# Patient Record
Sex: Female | Born: 1970 | Race: White | Hispanic: No | Marital: Married | Smoking: Never smoker
Health system: Southern US, Community
[De-identification: ages and names within clinical notes are randomized; demographics above are authoritative.]

## PROBLEM LIST (undated history)

## (undated) DIAGNOSIS — N289 Disorder of kidney and ureter, unspecified: Secondary | ICD-10-CM

---

## 1998-02-09 ENCOUNTER — Ambulatory Visit (HOSPITAL_COMMUNITY): Admission: RE | Admit: 1998-02-09 | Discharge: 1998-02-09 | Payer: Self-pay | Admitting: Obstetrics and Gynecology

## 1998-10-14 ENCOUNTER — Inpatient Hospital Stay (HOSPITAL_COMMUNITY): Admission: AD | Admit: 1998-10-14 | Discharge: 1998-10-14 | Payer: Self-pay | Admitting: Obstetrics and Gynecology

## 1998-10-14 ENCOUNTER — Encounter: Payer: Self-pay | Admitting: Obstetrics & Gynecology

## 1998-10-14 ENCOUNTER — Inpatient Hospital Stay (HOSPITAL_COMMUNITY): Admission: AD | Admit: 1998-10-14 | Discharge: 1998-10-16 | Payer: Self-pay | Admitting: Obstetrics & Gynecology

## 1998-10-15 ENCOUNTER — Encounter: Payer: Self-pay | Admitting: Obstetrics & Gynecology

## 1999-03-18 ENCOUNTER — Other Ambulatory Visit: Admission: RE | Admit: 1999-03-18 | Discharge: 1999-03-18 | Payer: Self-pay | Admitting: Obstetrics and Gynecology

## 1999-07-02 ENCOUNTER — Inpatient Hospital Stay (HOSPITAL_COMMUNITY): Admission: AD | Admit: 1999-07-02 | Discharge: 1999-07-02 | Payer: Self-pay | Admitting: Obstetrics and Gynecology

## 1999-07-05 ENCOUNTER — Inpatient Hospital Stay (HOSPITAL_COMMUNITY): Admission: AD | Admit: 1999-07-05 | Discharge: 1999-07-05 | Payer: Self-pay | Admitting: *Deleted

## 1999-07-09 ENCOUNTER — Ambulatory Visit (HOSPITAL_COMMUNITY): Admission: RE | Admit: 1999-07-09 | Discharge: 1999-07-09 | Payer: Self-pay | Admitting: Obstetrics and Gynecology

## 1999-07-19 ENCOUNTER — Inpatient Hospital Stay (HOSPITAL_COMMUNITY): Admission: AD | Admit: 1999-07-19 | Discharge: 1999-07-28 | Payer: Self-pay | Admitting: Obstetrics and Gynecology

## 1999-07-22 ENCOUNTER — Encounter: Payer: Self-pay | Admitting: Obstetrics and Gynecology

## 1999-07-27 ENCOUNTER — Encounter: Payer: Self-pay | Admitting: Obstetrics and Gynecology

## 1999-08-11 ENCOUNTER — Inpatient Hospital Stay (HOSPITAL_COMMUNITY): Admission: AD | Admit: 1999-08-11 | Discharge: 1999-08-11 | Payer: Self-pay | Admitting: *Deleted

## 1999-08-21 ENCOUNTER — Inpatient Hospital Stay (HOSPITAL_COMMUNITY): Admission: AD | Admit: 1999-08-21 | Discharge: 1999-08-21 | Payer: Self-pay | Admitting: Physical Therapy

## 1999-10-05 ENCOUNTER — Other Ambulatory Visit: Admission: RE | Admit: 1999-10-05 | Discharge: 1999-10-05 | Payer: Self-pay | Admitting: Obstetrics and Gynecology

## 2000-10-25 ENCOUNTER — Other Ambulatory Visit: Admission: RE | Admit: 2000-10-25 | Discharge: 2000-10-25 | Payer: Self-pay | Admitting: Obstetrics and Gynecology

## 2001-09-11 ENCOUNTER — Encounter (INDEPENDENT_AMBULATORY_CARE_PROVIDER_SITE_OTHER): Payer: Self-pay | Admitting: Specialist

## 2001-09-11 ENCOUNTER — Ambulatory Visit (HOSPITAL_BASED_OUTPATIENT_CLINIC_OR_DEPARTMENT_OTHER): Admission: RE | Admit: 2001-09-11 | Discharge: 2001-09-11 | Payer: Self-pay | Admitting: Plastic Surgery

## 2002-03-15 ENCOUNTER — Other Ambulatory Visit: Admission: RE | Admit: 2002-03-15 | Discharge: 2002-03-15 | Payer: Self-pay | Admitting: Obstetrics and Gynecology

## 2003-07-07 ENCOUNTER — Other Ambulatory Visit: Admission: RE | Admit: 2003-07-07 | Discharge: 2003-07-07 | Payer: Self-pay | Admitting: Obstetrics and Gynecology

## 2003-12-25 ENCOUNTER — Encounter: Admission: RE | Admit: 2003-12-25 | Discharge: 2003-12-25 | Payer: Self-pay | Admitting: Neurology

## 2004-01-03 ENCOUNTER — Encounter: Admission: RE | Admit: 2004-01-03 | Discharge: 2004-01-03 | Payer: Self-pay | Admitting: Neurology

## 2004-03-26 ENCOUNTER — Encounter (INDEPENDENT_AMBULATORY_CARE_PROVIDER_SITE_OTHER): Payer: Self-pay | Admitting: Specialist

## 2004-03-26 ENCOUNTER — Ambulatory Visit (HOSPITAL_COMMUNITY): Admission: RE | Admit: 2004-03-26 | Discharge: 2004-03-26 | Payer: Self-pay | Admitting: Obstetrics and Gynecology

## 2005-02-03 ENCOUNTER — Other Ambulatory Visit: Admission: RE | Admit: 2005-02-03 | Discharge: 2005-02-03 | Payer: Self-pay | Admitting: Obstetrics and Gynecology

## 2005-06-20 ENCOUNTER — Encounter (INDEPENDENT_AMBULATORY_CARE_PROVIDER_SITE_OTHER): Payer: Self-pay | Admitting: *Deleted

## 2005-06-20 ENCOUNTER — Ambulatory Visit (HOSPITAL_COMMUNITY): Admission: RE | Admit: 2005-06-20 | Discharge: 2005-06-21 | Payer: Self-pay | Admitting: Obstetrics and Gynecology

## 2008-06-23 ENCOUNTER — Encounter: Admission: RE | Admit: 2008-06-23 | Discharge: 2008-06-23 | Payer: Self-pay | Admitting: Obstetrics and Gynecology

## 2010-07-16 NOTE — Discharge Summary (Signed)
NAME:  Kelly Davenport, BARIA NO.:  [id number]   MEDICAL RECORD NO.:  [id number]          PATIENT TYPE:  OIB   LOCATION:  9309                          FACILITY:  [facility]   PHYSICIAN:  Duke Salvia. Marcelle Overlie, M.D.DATE OF BIRTH:  11-Jul-1970   DATE OF ADMISSION:  06/20/2005  DATE OF DISCHARGE:  06/21/2005                                 DISCHARGE SUMMARY   DISCHARGE DIAGNOSES:  1.  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  2.  Laparoscopic-assisted hysterectomy this admission.   SUMMARY OF HISTORY AND PHYSICAL EXAMINATION:  Please see admission H&P for  details.  Briefly, a 40 year old, prior NovaSure endometrial ablation,  presents with severe dysmenorrhea coming now for laparoscopic-assisted  hysterectomy.   HOSPITAL COURSE:  On April 23 under general anesthesia, the patient  underwent laparoscopic-assisted  hysterectomy with less than 100 mL blood  loss. The first postop day, the catheter was removed.  She was afebrile,  ambulating without difficulty.  Her abdominal exam was unremarkable, and she  was ready for discharge at that point.   LABORATORY DATA:  Preop CBC: WBC 5.2, hemoglobin 13.8.  Blood type is A  negative, antibody screen negative.  Postop CBC on April 24: WBC 10.3,  hemoglobin 11.5.   DISPOSITION:  The patient is discharged on Tylox p.r.n. pain. Will return to  the office in one week and asked to report any incisional redness or  drainage, increased pain or bleeding, or fever over 101.  She was given  specific instructions regarding diet, exercise.   CONDITION:  Good.   ACTIVITY:  Graded increase.      Richard M. Marcelle Overlie, M.D.  Electronically Signed     RMH/MEDQ  D:  06/21/2005  T:  06/21/2005  Job:  [id number]

## 2010-07-16 NOTE — Discharge Summary (Signed)
[facility]  Patient:    Kelly Davenport, Kelly Davenport                    MRN: [id number] Adm. Date:  [id number] Disc. Date: [id number] Attending:  Donne Hazel Dictator:   Danie Chandler, R.N.                           Discharge Summary  ADMITTING DIAGNOSIS:          In vitro fertilization twin gestation at 29-4/[redacted] weeks gestation with preterm labor.  DISCHARGE DIAGNOSIS:          In vitro fertilization twin gestation at 29-4/[redacted] weeks gestation with preterm labor.  PROCEDURE:                    Magnesium tocolysis.  REASON FOR ADMISSION;         The patient is a 40 year old gravida 3, para 0 with IVF twin gestation at 29-4/[redacted] weeks gestation on Terbutaline pump.  She came to maternity admissions unit early on the morning of Jul 19, 1999, complaining of uterine contractions.  She was admitted for magnesium wash. The patient received betamethasone and was placed on Unasyn and magnesium, also.  HOSPITAL COURSE:              The patients laboratory studies on admission showed hemoglobin 10.3, platelets 188,000.  Creatinine was 0.6, SGOT 21, SGPT 19, LDH 136.  Uric acid was 4.1.  The patient was continued on magnesium sulfate tocolysis, and eventually was weaned to a Terbutaline pump.  She was discharged to home on Jul 28, 1999.  CONDITION ON DISCHARGE:       Stable.  DISCHARGE INSTRUCTIONS:       She is to return to the office in one week. S he is to have weekly nonstress tests to start at 32 weeks.  She is to be on strict rest.  DISCHARGE MEDICATIONS:        Terbutaline pump as managed by Barbee Shropshire as well as home urine activity monitoring.  Prenatal vitamin, one p.o. q.d.  Iron over-the-counter q.d.  Colace over-the-counter q.d.  She was given preterm labor precautions, and she is to call for any of those indications. DD:  08/12/99 TD:  08/16/99 Job: [id number]

## 2010-07-16 NOTE — H&P (Signed)
NAME:  Kelly Davenport, Kelly Davenport NO.:  [id number]   MEDICAL RECORD NO.:  [id number]           PATIENT TYPE:   LOCATION:                                 FACILITY:   PHYSICIAN:  Duke Salvia. Marcelle Overlie, M.D.    DATE OF BIRTH:   DATE OF ADMISSION:  03/26/2004  DATE OF DISCHARGE:                                HISTORY & PHYSICAL   CHIEF COMPLAINT:  Persistent menorrhagia.   HISTORY OF PRESENT ILLNESS:  A 40 year old G3, P2.  The patient has had  bilateral salpingectomies.  She conceived with IVF her last pregnancy.  For  about a year, she has had problems with heavy irregular bleeding and  dysmenorrhea, initially tried Montserrat for ovulation suppression followed  by Ovcon without success.   SHG in our office December 05, 2003, showed a normal sized uterus, no fluid in  the cul de sac, some increased thickened endometrium along the posterior  wall but on evidence of polyp.  She presents now for [facility], hysteroscopy, and  Novasure endometrial ablation. This procedure including risk of bleeding,  infection, other complications such as perforation that may require  additional surgery all reviewed with her which she understands and accepts.   ALLERGIES:  None.   CURRENT MEDICATIONS:  Ovcon and iron.   OTHER SURGERY:  She has had a prior D&C, hysteroscopy, and laparoscopy.   FAMILY HISTORY:  Significant for a mother with hypertension, father with  heart disease.   PHYSICAL EXAMINATION:  VITAL SIGNS:  Temperature 98.2, blood pressure  120/62.  HEENT:  Unremarkable.  NECK:  Supple without masses.  LUNGS:  Clear.  CARDIOVASCULAR:  Regular rate and rhythm without murmurs, rubs, or gallops.  BREASTS:  Without masses.  ABDOMEN:  Soft, flat, and nontender.  PELVIC:  Normal external genitalia.  Vagina and cervix clear.  Uterus mid  position, normal size.  Adnexa negative.  EXTREMITIES:  Neurologic exam unremarkable.   IMPRESSION:  Persistent menorrhagia.   PLAN:  Dilation and  curettage, hysteroscopy, Novasure endometrial ablation.  Procedure and risks reviewed as above.      RMH/MEDQ  D:  03/24/2004  T:  03/24/2004  Job:  [id number]

## 2010-07-16 NOTE — Op Note (Signed)
NAME:  Kelly Davenport, Kelly Davenport NO.:  [id number]   MEDICAL RECORD NO.:  [id number]          PATIENT TYPE:  AMB   LOCATION:  SDC                           [facility]ILITY:  [facility]   PHYSICIAN:  Duke Salvia. Marcelle Overlie, M.D.DATE OF BIRTH:  [birth year]   DATE OF PROCEDURE:  06/20/2005  DATE OF DISCHARGE:                                 OPERATIVE REPORT   PREOP DIAGNOSIS:  Dysmenorrhea status post endometrial ablation, probable  adenomyosis.   POSTOP DIAGNOSIS:  Dysmenorrhea status post endometrial ablation, probable  adenomyosis.   PROCEDURE:  LSH.   SURGEON:  Duke Salvia. Marcelle Overlie, M.D.   ASSISTANT:  Juluis Mire, M.D.   ANESTHESIA:  General endotracheal.   COMPLICATIONS:  None.   DRAINS:  Foley catheter.   BLOOD LOSS:  Less than 100 mL.   SPECIMENS REMOVED:  Uterine fundus, morcellated.   COMPLICATIONS:  None.   PROCEDURE AND FINDINGS:  The patient was taken to the operating room and  after an adequate level of general endotracheal anesthesia was obtained, the  patient legs in stirrups, the abdomen, perineum and vagina prepped and  draped with Betadine, EUA was carried out.  Uterus was normal size, mobile,  adnexa negative. Foley catheter was positioned. The subumbilical area was  infiltrated with 0.5%  Marcaine plain and a small incision was made. The  Veress needle was introduced without difficulty.  Its intra-abdominal  position was verified by pressure and water testing. After 1.5 liter  pneumoperitoneum was created, laparoscopic trocar and sleeve were then  introduced without difficulty. Two lower lateral ports were non-bladed 10/11  trocars placed in the left to right lower quadrant after negative  transillumination. The patient placed in Trendelenburg. The uterus itself  was normal size. Both tubes were surgically absent. Both ovaries were  unremarkable. Using the gyrus transector instrument, the utero-ovarian  pedicle was coagulated and cut down to and including the  round ligament on  each side. The peritoneum was then carried around to the midline and the  bladder was bluntly dissected below. The uterine vascular pedicle on either  side was skeletonized.  Using initially coagulation power, the ascending  branch of the uterine close to the uterus was coagulated in two to three  separate areas and cut on each side.  The same instrument was then used to  transect the cervix with excellent hemostasis. The morcellator was then  introduced into the left lower incision. The uterine fundus specimen was  morcellated. Care was taken to irrigate and remove all uterine fragments  post morcellation. The cervical stump was then grasped, and the canal was  coagulated with the trisector instrument on coag mode in a 360 degree  fashion. Pressure was reduced. Excellent hemostasis was noted throughout.  Interceed was placed across the cervical stump.  Instruments removed.  The  left lower incision fascia was closed with  interrupted 2-0 Vicryl sutures. The subumbilical incision was closed with 4-  0 Vicryl subcuticular and the skin on both lower incisions closed with  Dermabond. She tolerated this well with recovery room in good condition.  Clear urine noted at the end  of the case.      Richard M. Marcelle Overlie, M.D.  Electronically Signed     RMH/MEDQ  D:  06/20/2005  T:  06/21/2005  Job:  [id number]

## 2010-07-16 NOTE — H&P (Signed)
NAME:  Kelly Davenport, Kelly Davenport NO.:  [id number]   MEDICAL RECORD NO.:  [id number]          PATIENT TYPE:  AMB   LOCATION:  SDC                           [facility]ACILITY:  [facility]   PHYSICIAN:  Duke Salvia. Marcelle Davenport, M.D.DATE OF BIRTH:  Feb 15, 1971   DATE OF ADMISSION:  DATE OF DISCHARGE:                                HISTORY & PHYSICAL   CHIEF COMPLAINT:  Dysmenorrhea.   HISTORY OF PRESENT ILLNESS:  A 40 year old G3, P2.  This patient has had a  prior bilateral salpingectomy in the past and underwent endometrial ablation  early 2006 which has helped with her menorrhagia, but she continues to have  some monthly bleeding associated with severe menstrual pain, and presents  now for [facility].  This procedure including risks of bleeding, infection,  transfusion, the need for a yearly cytology, the small chance of monthly  light bleeding after [facility] discussed.  Additionally, her expected recovery  time along with the possible need for open or additional surgery reviewed,  which she understands and accepts.   PAST MEDICAL HISTORY:  She has had cervical cryosurgery in the past but her  Paps since that time have been normal.  Laparoscopy in 1997 showed she had  bilateral chronic PID with adnexal adhesions and had one prior ectopic  pregnancy in 1998 with a partial salpingectomy at that time.  Prior to IVF  at [facility] she had bilateral laparoscopic salpingectomy.  She has also had ear  surgery and excision of an early-stage melanoma.   ALLERGIES:  None.   FAMILY HISTORY:  Significant for father with diet-controlled diabetes.  Mother with chronic hypertension.   Laparoscopy done in 1997 showed, again, chronic PID changes and she had  lysis of adhesions.   PHYSICAL EXAMINATION:  VITAL SIGNS:  Temperature 98.2, blood pressure  120/68.  HEENT:  Unremarkable.  NECK:  Supple without masses.  LUNGS:  Clear.  CARDIOVASCULAR:  Regular rate and rhythm without murmurs, rubs, or gallops  noted.  BREASTS:   Without masses.  ABDOMEN:  Soft, flat, and nontender.  PELVIC:  Normal external genitalia, vagina and cervix clear.  Uterus  midposition, normal size.  Adnexa negative.  EXTREMITIES AND NEUROLOGIC:  Unremarkable.   IMPRESSION:  Severe dysmenorrhea status post endometrial ablation, probable  adenomyosis.   PLAN:  LSH.  Procedure and risks reviewed as above.      Kelly Davenport, M.D.  Electronically Signed     RMH/MEDQ  D:  06/17/2005  T:  06/17/2005  Job:  [id number]

## 2010-07-16 NOTE — Op Note (Signed)
NAME:  Kelly Davenport NO.:  [id number]   MEDICAL RECORD NO.:  [id number]           PATIENT TYPE:   LOCATION:                                 FACILITY:   PHYSICIAN:  Duke Salvia. Marcelle Overlie, M.D.    DATE OF BIRTH:   DATE OF PROCEDURE:  03/26/2004  DATE OF DISCHARGE:                                 OPERATIVE REPORT   PREOPERATIVE DIAGNOSES:  Abnormal uterine bleeding.   POSTOPERATIVE DIAGNOSES:  Abnormal uterine bleeding.   PROCEDURE:  D&C hysteroscopy, NovaSure endometrial ablation.   SURGEON:  Duke Salvia. Marcelle Overlie, M.D.   ANESTHESIA:  Sedation plus paracervical block.   PROCEDURE AND FINDINGS:  The patient was taken to the operating room and  after an adequate level of sedation was obtained with the patient's legs in  stirrups. The perineum and vagina were prepped and draped with Betadine, the  bladder was drained, EUA carried out, uterus mid position, normal size,  adnexa negative. The speculum was positioned, cervix grasped with a  tenaculum, paracervical block created by infiltrating at 3 and 9 o'clock  submucosally 5-7 mL of 1% Xylocaine on either side after negative  aspiration.  The uterus was then sounded to 7.5 cm, progressively dilated to  an 8-9 Hegar dilator. Cervical length was estimated at 2.5 cm.  A 7 mm  hysteroscope was inserted, the cavity showed some slight endometrial buildup  along the left lateral posterior wall, D&C was carried out, minimal tissue  submitted to pathology. Reinspection with the hysteroscope revealed the  cavity to be clean. After this was completed, the NovaSure endometrial  ablation was carried out per protocol with a cavity depth of 5 cm, width  assessed at 2.5, power setting of 69 with coagulation time of 1 minute and  17 seconds.  This was done after passing the CO2 test per protocol. She  tolerated this well, went to recovery room in good condition.      RMH/MEDQ  D:  03/26/2004  T:  03/26/2004  Job:  [id number]

## 2015-10-31 ENCOUNTER — Encounter (HOSPITAL_COMMUNITY): Payer: Self-pay

## 2015-10-31 ENCOUNTER — Emergency Department (HOSPITAL_COMMUNITY)
Admission: EM | Admit: 2015-10-31 | Discharge: 2015-10-31 | Disposition: A | Discharge status: Home / Self Care | Payer: BLUE CROSS/BLUE SHIELD | Attending: Emergency Medicine | Admitting: Emergency Medicine

## 2015-10-31 DIAGNOSIS — R109 Unspecified abdominal pain: Secondary | ICD-10-CM

## 2015-10-31 DIAGNOSIS — R1032 Left lower quadrant pain: Secondary | ICD-10-CM | POA: Insufficient documentation

## 2015-10-31 HISTORY — DX: Disorder of kidney and ureter, unspecified: N28.9

## 2015-10-31 LAB — COMPREHENSIVE METABOLIC PANEL
ALK PHOS: 57 U/L (ref 38–126)
ALT: 22 U/L (ref 14–54)
AST: 22 U/L (ref 15–41)
Albumin: 4.5 g/dL (ref 3.5–5.0)
Anion gap: 11 (ref 5–15)
BILIRUBIN TOTAL: 0.5 mg/dL (ref 0.3–1.2)
BUN: 17 mg/dL (ref 6–20)
CALCIUM: 10.5 mg/dL — AB (ref 8.9–10.3)
CO2: 21 mmol/L — ABNORMAL LOW (ref 22–32)
CREATININE: 0.91 mg/dL (ref 0.44–1.00)
Chloride: 107 mmol/L (ref 101–111)
Glucose, Bld: 107 mg/dL — ABNORMAL HIGH (ref 65–99)
Potassium: 4.2 mmol/L (ref 3.5–5.1)
Sodium: 139 mmol/L (ref 135–145)
Total Protein: 7.5 g/dL (ref 6.5–8.1)

## 2015-10-31 LAB — CBC
HCT: 44.4 % (ref 36.0–46.0)
Hemoglobin: 14.8 g/dL (ref 12.0–15.0)
MCH: 32 pg (ref 26.0–34.0)
MCHC: 33.3 g/dL (ref 30.0–36.0)
MCV: 95.9 fL (ref 78.0–100.0)
PLATELETS: 246 10*3/uL (ref 150–400)
RBC: 4.63 MIL/uL (ref 3.87–5.11)
RDW: 12.5 % (ref 11.5–15.5)
WBC: 8 10*3/uL (ref 4.0–10.5)

## 2015-10-31 LAB — URINE MICROSCOPIC-ADD ON
BACTERIA UA: NONE SEEN
SQUAMOUS EPITHELIAL / LPF: NONE SEEN
WBC, UA: NONE SEEN WBC/hpf (ref 0–5)

## 2015-10-31 LAB — URINALYSIS, ROUTINE W REFLEX MICROSCOPIC
BILIRUBIN URINE: NEGATIVE
GLUCOSE, UA: NEGATIVE mg/dL
Ketones, ur: 15 mg/dL — AB
Leukocytes, UA: NEGATIVE
Nitrite: NEGATIVE
PH: 6 (ref 5.0–8.0)
Protein, ur: NEGATIVE mg/dL
SPECIFIC GRAVITY, URINE: 1.015 (ref 1.005–1.030)

## 2015-10-31 LAB — LIPASE, BLOOD: Lipase: 22 U/L (ref 11–51)

## 2015-10-31 LAB — PREGNANCY, URINE: Preg Test, Ur: NEGATIVE

## 2015-10-31 MED ORDER — ONDANSETRON HCL 4 MG/2ML IJ SOLN
4.0000 mg | Freq: Once | INTRAMUSCULAR | Status: AC
  Administered 2015-10-31: 4 mg via INTRAVENOUS
  Filled 2015-10-31: qty 2

## 2015-10-31 MED ORDER — ONDANSETRON HCL 4 MG PO TABS: 4.0000 mg | ORAL_TABLET | Freq: Four times a day (QID) | ORAL | 0 refills | Status: DC

## 2015-10-31 MED ORDER — SODIUM CHLORIDE 0.9 % IV BOLUS (SEPSIS)
1000.0000 mL | Freq: Once | INTRAVENOUS | Status: AC
  Administered 2015-10-31: 1000 mL via INTRAVENOUS

## 2015-10-31 MED ORDER — MORPHINE SULFATE (PF) 4 MG/ML IV SOLN
4.0000 mg | Freq: Once | INTRAVENOUS | Status: AC
  Administered 2015-10-31: 4 mg via INTRAVENOUS
  Filled 2015-10-31: qty 1

## 2015-10-31 NOTE — ED Notes (Signed)
Patient Alert and oriented X4. Stable and ambulatory. Patient verbalized understanding of the discharge instructions.  Patient belongings were taken by the patient.  

## 2015-10-31 NOTE — ED Provider Notes (Signed)
[facility]-EMERGENCY DEPT Provider Note   CSN: [id number] Arrival date & time: 10/31/15  [id number]     History   Chief Complaint Chief Complaint  Patient presents with  . Abdominal Pain    HPI Currie ParisLaurie D Brach is a 45 y.o. female.  HPI  45 y.o. female presents to the Emergency Department today complaining of LLQ abdominal pain x 1 week. States that the pain is constant ant 8/10. Notes dull ache. Difficult to find comfortable position. Seen by PCP x 1 week ago with urine done that showed Hgb and bacteria. Treated for UTI. Pt noted continued pain in that area despite treatment with ABX. Noted emesis and increased severity of symptoms on Wednesday, but since resolved. Notes persistent nausea today. No CP/SOB. No dysuria. No fevers. No vaginal bleeding/discharge. No hematochezia or melena. Last BM this morning. No other symptoms noted. No hx diverticulosis. No hx ABD surgeries.    Past Medical History:  Diagnosis Date  . Renal disorder     There are no active problems to display for this patient.   History reviewed. No pertinent surgical history.  OB History    No data available       Home Medications    Prior to Admission medications   Not on File    Family History No family history on file.  Social History Social History  Substance Use Topics  . Smoking status: Never Smoker  . Smokeless tobacco: Never Used  . Alcohol use Not on file     Allergies   Review of patient's allergies indicates no known allergies.   Review of Systems Review of Systems ROS reviewed and all are negative for acute change except as noted in the HPI.  Physical Exam Updated Vital Signs BP (!) 164/102 (BP Location: Right Arm)   Pulse 72   Temp 99.7 F (37.6 C) (Oral)   Resp 18   Ht 5\' 5"  (1.651 m)   Wt 92.1 kg   SpO2 97%   BMI 33.78 kg/m   Physical Exam  Constitutional: She is oriented to person, place, and time. Vital signs are normal. She appears well-developed and well-nourished.    HENT:  Head: Normocephalic.  Right Ear: Hearing normal.  Left Ear: Hearing normal.  Eyes: Conjunctivae and EOM are normal. Pupils are equal, round, and reactive to light.  Neck: Normal range of motion. Neck supple.  Cardiovascular: Normal rate, regular rhythm, normal heart sounds and intact distal pulses.   Pulmonary/Chest: Effort normal and breath sounds normal.  Abdominal: Soft. Normal appearance and bowel sounds are normal. There is tenderness in the left lower quadrant. There is no rigidity, no rebound, no guarding, no CVA tenderness, no tenderness at McBurney's point and negative Murphy's sign.  Pain on LLQ more towards left flank. No suprapubic tenderness.   Neurological: She is alert and oriented to person, place, and time.  Skin: Skin is warm and dry.  Psychiatric: She has a normal mood and affect. Her speech is normal and behavior is normal. Thought content normal.   ED Treatments / Results  Labs (all labs ordered are listed, but only abnormal results are displayed) Labs Reviewed  URINALYSIS, ROUTINE W REFLEX MICROSCOPIC (NOT AT [facility]) - Abnormal; Notable for the following:       Result Value   Hgb urine dipstick MODERATE (*)    Ketones, ur 15 (*)    All other components within normal limits  COMPREHENSIVE METABOLIC PANEL - Abnormal; Notable for the following:    CO2  21 (*)    Glucose, Bld 107 (*)    Calcium 10.5 (*)    All other components within normal limits  LIPASE, BLOOD  CBC  URINE MICROSCOPIC-ADD ON  PREGNANCY, URINE   EKG  EKG Interpretation None      Radiology No results found.  Procedures Procedures (including critical care time)  Medications Ordered in ED Medications - No data to display   Initial Impression / Assessment and Plan / ED Course  I have reviewed the triage vital signs and the nursing notes.  Pertinent labs & imaging results that were available during my care of the patient were reviewed by me and considered in my medical decision  making (see chart for details).  Clinical Course   Final Clinical Impressions(s) / ED Diagnoses  I have reviewed and evaluated the relevant laboratory valuesI have reviewed and evaluated the relevant imaging studies. I have reviewed the relevant previous healthcare records.I obtained HPI from historian. Patient discussed with supervising physician  ED Course:  Assessment: Pt is a 45yF who presents with LLQ/Left Flank abd pain x 1 week. Treated for UTI, but pain continues. Has hx stones. On exam, pt in NAD. Nontoxic/nonseptic appearing. VSS. Afebrile. Lungs CTA. Heart RRR. Abdomen nontender soft. No CVA on left flank. Labs unremarkable. UA shows HGB which could possibly represent nephrolithaisis. Bedside ultrasound showed no hydronephrosis (see supervising physician note). Given analgesia and fluids in ED. Seen by supervising physician. Likely residual stone. Plan is to DC Home with follow up to PCP. At time of discharge, Patient is in no acute distress. Vital Signs are stable. Patient is able to ambulate. Patient able to tolerate PO.    Disposition/Plan:  DC Home Additional Verbal discharge instructions given and discussed with patient.  Pt Instructed to f/u with PCP in the next week for evaluation and treatment of symptoms. Return precautions given Pt acknowledges and agrees with plan  Supervising Physician Nira Conn, MD   Final diagnoses:  Left flank pain    New Prescriptions New Prescriptions   No medications on file     Audry Pili, PA-C 10/31/15 2135    Nira Conn, MD 11/01/15 0040

## 2015-10-31 NOTE — Discharge Instructions (Signed)
Please read and follow all provided instructions.  Your diagnoses today include:  1. Left flank pain    Tests performed today include: Blood counts and electrolytes Blood tests to check liver and kidney function Blood tests to check pancreas function Urine test to look for infection and pregnancy (in women) Vital signs. See below for your results today.   Medications prescribed:   Take any prescribed medications only as directed. You can use Ibuprofen 400mg  combined with Tylenol 1000mg  for pain relief every 6 hours. Do not exceed 4g of Tylenol in one 24 hour period. Do not exceed 10 days of this regiment.  Home care instructions:  Follow any educational materials contained in this packet.  Follow-up instructions: Please follow-up with your primary care provider in the next 2 days for further evaluation of your symptoms.    Return instructions:  SEEK IMMEDIATE MEDICAL ATTENTION IF: The pain does not go away or becomes severe  A temperature above 101F develops  Repeated vomiting occurs (multiple episodes)  The pain becomes localized to portions of the abdomen. The right side could possibly be appendicitis. In an adult, the left lower portion of the abdomen could be colitis or diverticulitis.  Blood is being passed in stools or vomit (bright red or black tarry stools)  You develop chest pain, difficulty breathing, dizziness or fainting, or become confused, poorly responsive, or inconsolable (young children) If you have any other emergent concerns regarding your health  Additional Information: Abdominal (belly) pain can be caused by many things. Your caregiver performed an examination and possibly ordered blood/urine tests and imaging (CT scan, x-rays, ultrasound). Many cases can be observed and treated at home after initial evaluation in the emergency department. Even though you are being discharged home, abdominal pain can be unpredictable. Therefore, you need a repeated exam if your  pain does not resolve, returns, or worsens. Most patients with abdominal pain don't have to be admitted to the hospital or have surgery, but serious problems like appendicitis and gallbladder attacks can start out as nonspecific pain. Many abdominal conditions cannot be diagnosed in one visit, so follow-up evaluations are very important.  Your vital signs today were: BP 138/77    Pulse 68    Temp 99.7 F (37.6 C) (Oral)    Resp 18    Ht 5\' 5"  (1.651 m)    Wt 92.1 kg    SpO2 97%    BMI 33.78 kg/m  If your blood pressure (bp) was elevated above 135/85 this visit, please have this repeated by your doctor within one month. --------------

## 2015-10-31 NOTE — ED Triage Notes (Signed)
Patient complains of LUQ/LLQ abdominal pain for the past week, has remote hx of kidney stone and assumed this pain was related to same. Has been on antibiotic the past week for uti and does report urinary urgency. Had vomiting on Wednesday but now persistent nausea. Alert and oriented.

## 2020-12-24 ENCOUNTER — Other Ambulatory Visit: Payer: Self-pay | Admitting: Obstetrics and Gynecology

## 2020-12-24 DIAGNOSIS — R928 Other abnormal and inconclusive findings on diagnostic imaging of breast: Secondary | ICD-10-CM

## 2020-12-31 ENCOUNTER — Other Ambulatory Visit: Payer: Self-pay

## 2020-12-31 ENCOUNTER — Ambulatory Visit
Admission: RE | Admit: 2020-12-31 | Discharge: 2020-12-31 | Disposition: A | Discharge status: Home / Self Care | Payer: No Typology Code available for payment source | Source: Ambulatory Visit | Attending: Obstetrics and Gynecology | Admitting: Obstetrics and Gynecology

## 2020-12-31 ENCOUNTER — Other Ambulatory Visit: Payer: Self-pay | Admitting: Obstetrics and Gynecology

## 2020-12-31 ENCOUNTER — Ambulatory Visit
Admission: RE | Admit: 2020-12-31 | Discharge: 2020-12-31 | Disposition: A | Discharge status: Home / Self Care | Payer: Self-pay | Source: Ambulatory Visit | Attending: Obstetrics and Gynecology | Admitting: Obstetrics and Gynecology

## 2020-12-31 DIAGNOSIS — R928 Other abnormal and inconclusive findings on diagnostic imaging of breast: Secondary | ICD-10-CM

## 2020-12-31 DIAGNOSIS — N6489 Other specified disorders of breast: Secondary | ICD-10-CM

## 2021-07-01 ENCOUNTER — Ambulatory Visit: Payer: No Typology Code available for payment source

## 2021-07-01 ENCOUNTER — Ambulatory Visit
Admission: RE | Admit: 2021-07-01 | Discharge: 2021-07-01 | Disposition: A | Discharge status: Home / Self Care | Payer: No Typology Code available for payment source | Source: Ambulatory Visit | Attending: Obstetrics and Gynecology | Admitting: Obstetrics and Gynecology

## 2021-07-01 DIAGNOSIS — N6489 Other specified disorders of breast: Secondary | ICD-10-CM

## 2022-04-16 ENCOUNTER — Emergency Department (HOSPITAL_COMMUNITY)
Admission: EM | Admit: 2022-04-16 | Discharge: 2022-04-16 | Disposition: A | Discharge status: Home / Self Care | Payer: No Typology Code available for payment source | Attending: Emergency Medicine | Admitting: Emergency Medicine

## 2022-04-16 ENCOUNTER — Other Ambulatory Visit: Payer: Self-pay

## 2022-04-16 ENCOUNTER — Encounter (HOSPITAL_COMMUNITY): Payer: Self-pay

## 2022-04-16 ENCOUNTER — Emergency Department (HOSPITAL_COMMUNITY): Payer: No Typology Code available for payment source

## 2022-04-16 DIAGNOSIS — N200 Calculus of kidney: Secondary | ICD-10-CM

## 2022-04-16 DIAGNOSIS — R1032 Left lower quadrant pain: Secondary | ICD-10-CM | POA: Diagnosis present

## 2022-04-16 LAB — URINALYSIS, ROUTINE W REFLEX MICROSCOPIC
Bilirubin Urine: NEGATIVE
Glucose, UA: NEGATIVE mg/dL
Ketones, ur: 20 mg/dL — AB
Leukocytes,Ua: NEGATIVE
Nitrite: NEGATIVE
Protein, ur: 30 mg/dL — AB
RBC / HPF: >50 RBC/hpf (ref 0–5)
Specific Gravity, Urine: 1.02 (ref 1.005–1.030)
pH: 5 (ref 5.0–8.0)

## 2022-04-16 LAB — COMPREHENSIVE METABOLIC PANEL
ALT: 25 U/L (ref 0–44)
AST: 23 U/L (ref 15–41)
Albumin: 4.2 g/dL (ref 3.5–5.0)
Alkaline Phosphatase: 61 U/L (ref 38–126)
Anion gap: 11 (ref 5–15)
BUN: 13 mg/dL (ref 6–20)
CO2: 22 mmol/L (ref 22–32)
Calcium: 9.6 mg/dL (ref 8.9–10.3)
Chloride: 106 mmol/L (ref 98–111)
Creatinine, Ser: 0.79 mg/dL (ref 0.44–1.00)
GFR, Estimated: >60 mL/min (ref >60)
Glucose, Bld: 169 mg/dL — ABNORMAL HIGH (ref 70–99)
Potassium: 3.6 mmol/L (ref 3.5–5.1)
Sodium: 139 mmol/L (ref 135–145)
Total Bilirubin: 0.5 mg/dL (ref 0.3–1.2)
Total Protein: 7.3 g/dL (ref 6.5–8.1)

## 2022-04-16 LAB — CBC
HCT: 42.1 % (ref 36.0–46.0)
Hemoglobin: 14.2 g/dL (ref 12.0–15.0)
MCH: 30.9 pg (ref 26.0–34.0)
MCHC: 33.7 g/dL (ref 30.0–36.0)
MCV: 91.7 fL (ref 80.0–100.0)
Platelets: 241 10*3/uL (ref 150–400)
RBC: 4.59 MIL/uL (ref 3.87–5.11)
RDW: 12.9 % (ref 11.5–15.5)
WBC: 8.3 10*3/uL (ref 4.0–10.5)
nRBC: 0 % (ref 0.0–0.2)

## 2022-04-16 LAB — LIPASE, BLOOD: Lipase: 26 U/L (ref 11–51)

## 2022-04-16 LAB — PREGNANCY, URINE: Preg Test, Ur: NEGATIVE

## 2022-04-16 MED ORDER — HYDROCODONE-ACETAMINOPHEN 5-325 MG PO TABS: 1.0000 | ORAL_TABLET | Freq: Four times a day (QID) | ORAL | 0 refills | Status: AC | PRN

## 2022-04-16 MED ORDER — TAMSULOSIN HCL 0.4 MG PO CAPS
0.4000 mg | ORAL_CAPSULE | ORAL | Status: AC
  Administered 2022-04-16: 0.4 mg via ORAL
  Filled 2022-04-16: qty 1

## 2022-04-16 MED ORDER — KETOROLAC TROMETHAMINE 10 MG PO TABS: 10.0000 mg | ORAL_TABLET | Freq: Four times a day (QID) | ORAL | 0 refills | Status: DC | PRN

## 2022-04-16 MED ORDER — ONDANSETRON HCL 4 MG/2ML IJ SOLN
4.0000 mg | Freq: Once | INTRAMUSCULAR | Status: AC
  Administered 2022-04-16: 4 mg via INTRAVENOUS
  Filled 2022-04-16: qty 2

## 2022-04-16 MED ORDER — TAMSULOSIN HCL 0.4 MG PO CAPS: 0.4000 mg | ORAL_CAPSULE | Freq: Every day | ORAL | 0 refills | Status: DC

## 2022-04-16 MED ORDER — FENTANYL CITRATE PF 50 MCG/ML IJ SOSY
50.0000 ug | PREFILLED_SYRINGE | Freq: Once | INTRAMUSCULAR | Status: AC
  Administered 2022-04-16: 50 ug via INTRAVENOUS
  Filled 2022-04-16: qty 1

## 2022-04-16 MED ORDER — SODIUM CHLORIDE 0.9 % IV BOLUS
1000.0000 mL | Freq: Once | INTRAVENOUS | Status: AC
  Administered 2022-04-16: 1000 mL via INTRAVENOUS

## 2022-04-16 MED ORDER — KETOROLAC TROMETHAMINE 15 MG/ML IJ SOLN
15.0000 mg | Freq: Once | INTRAMUSCULAR | Status: AC
  Administered 2022-04-16: 15 mg via INTRAVENOUS
  Filled 2022-04-16: qty 1

## 2022-04-16 MED ORDER — ONDANSETRON HCL 4 MG PO TABS: 4.0000 mg | ORAL_TABLET | Freq: Four times a day (QID) | ORAL | 0 refills | Status: DC

## 2022-04-16 NOTE — ED Notes (Signed)
Patient transported to CT 

## 2022-04-16 NOTE — Discharge Instructions (Addendum)
Please return to the ED with any new or worsening signs or symptoms such as fevers, nausea or vomiting refractory to Zofran, inability to urinate Please read the attached guides concerning kidney stones and dietary guidelines to prevent kidney stones Please follow-up with urology.  Please call and make an appointment to be seen. Please take tamsulosin once daily.  This will be to pass the stone.  Please take Zofran as needed every 6 hours for nausea.  Please take hydrocodone pain medication every 6 hours as needed for pain. Please utilize urine strainer to collect stone.  Please push fluids.

## 2022-04-16 NOTE — ED Notes (Signed)
AVS reviewed with pt prior to discharge. Pt verbalizes understanding. Belongings with pt upon depart. Ambulatory to lobby to wait for ride.

## 2022-04-16 NOTE — ED Triage Notes (Addendum)
Pt arrived POV from home stating she started having this horrible pain in her LLQ this morning around 545 am. Pt states it hurts to sit up and stand that lying back is a little better but she cannot get comfortable. Pt states the pain moved to her back for just a little bit but now is all back in the front lower quadrant. Pt endorses N/V.

## 2022-04-16 NOTE — ED Provider Notes (Signed)
52 year old female known history of kidney stones presents with acute onset of left lower quadrant and left flank pain that started this morning.  On exam has a nonperitoneal abdomen with normal vital signs except for mild hypertension.  Ultrasound performed, see note below.  Has mild hydronephrosis consistent with obstructive uropathy, symptomatic treatment, make sure it is not infected, patient agreeable, anticipate discharge.  Ultrasound ED Renal  Date/Time: 04/16/2022 10:32 AM  Performed by: Noemi Chapel, MD Authorized by: Noemi Chapel, MD   Procedure details:    Indications: hydronephrosis     Technique:  L kidney and R kidneyImages: archivedStudy Limitations: body habitus and bowel gas Left kidney findings:    Mass: not identified     Nephrolithiasis: not identified     Renal stones: not identified     Ureteral jets: not identified     Intra-abdominal fluid: not identified     Perinephric fluid: not identified     Hydronephrosis: mild   Right kidney findings:    Mass: not identified     Nephrolithiasis: not identified     Renal stones: not identified     Ureteral jets: not identified     Intra-abdominal fluid: not identified     Perinephric fluid: not identified     Hydronephrosis: none   Bladder findings:    Free pelvic fluid: not identified   Comments:     L sided hydro - mild to moderate  Medical screening examination/treatment/procedure(s) were conducted as a shared visit with non-physician practitioner(s) and myself.  I personally evaluated the patient during the encounter.  Clinical Impression:   Final diagnoses:  None         Noemi Chapel, MD 04/17/22 [phone]

## 2022-04-16 NOTE — ED Provider Notes (Signed)
[place] Provider Note   CSN: [id number] Arrival date & time: 04/16/22  [id number]     History  Chief Complaint  Patient presents with   Abdominal Pain    Kelly Davenport is a 52 y.o. female with medical history of renal disorder, kidney stones.  Patient presents to ED for evaluation of left-sided abdominal pain.  Patient reports that this morning around 5 AM she was woken from her sleep with left lower quadrant abdominal pain.  Patient reports that since this time the pain has progressively moved from her left lower quadrant to her left flank.  Patient reports that she had 1 episode of nausea and vomiting secondary to pain this morning.  Patient reports history of kidney stones, states that this feels like a kidney stone.  Patient reports feeling as if she needs to use the bathroom, urinate, however cannot.  Patient denies fevers, diarrhea, vaginal discharge, chest pain, shortness of breath.  Patient denies seeing urology currently.  Patient reports last kidney stone was in 2017.   Abdominal Pain Associated symptoms: dysuria, nausea and vomiting   Associated symptoms: no chest pain, no diarrhea, no fever, no shortness of breath and no vaginal discharge        Home Medications Prior to Admission medications   Medication Sig Start Date End Date Taking? Authorizing Provider  HYDROcodone-acetaminophen (NORCO/VICODIN) 5-325 MG tablet Take 1 tablet by mouth every 6 (six) hours as needed for severe pain. 04/16/22  Yes Azucena Cecil, PA-C  ondansetron (ZOFRAN) 4 MG tablet Take 1 tablet (4 mg total) by mouth every 6 (six) hours. 04/16/22  Yes Azucena Cecil, PA-C  tamsulosin (FLOMAX) 0.4 MG CAPS capsule Take 1 capsule (0.4 mg total) by mouth daily. 04/16/22  Yes Azucena Cecil, PA-C  Multiple Vitamin (MULTIVITAMIN) tablet Take 1 tablet by mouth daily.    [provider]  nitrofurantoin, macrocrystal-monohydrate, (MACROBID) 100  MG capsule Take 100 mg by mouth 2 (two) times daily. Finished 10-31-15 10/26/15   [provider]  ondansetron (ZOFRAN) 4 MG tablet Take 1 tablet (4 mg total) by mouth every 6 (six) hours. 10/31/15   Shary Decamp, PA-C  ranitidine (ZANTAC) 150 MG tablet Take 150 mg by mouth daily.    [provider]      Allergies    Patient has no known allergies.    Review of Systems   Review of Systems  Constitutional:  Negative for fever.  Respiratory:  Negative for shortness of breath.   Cardiovascular:  Negative for chest pain.  Gastrointestinal:  Positive for abdominal pain, nausea and vomiting. Negative for diarrhea.  Genitourinary:  Positive for dysuria and flank pain. Negative for vaginal discharge.  All other systems reviewed and are negative.   Physical Exam Updated Vital Signs BP 134/80 (BP Location: Left Arm)   Pulse 76   Resp 18   Ht 5' 3"$  (1.6 m)   Wt 77.1 kg   SpO2 100%   BMI 30.11 kg/m  Physical Exam Vitals and nursing note reviewed.  Constitutional:      General: She is not in acute distress.    Appearance: She is well-developed. She is not ill-appearing, toxic-appearing or diaphoretic.  HENT:     Head: Normocephalic and atraumatic.     Mouth/Throat:     Mouth: Mucous membranes are moist.     Pharynx: Oropharynx is clear.  Eyes:     Extraocular Movements: Extraocular movements intact.  Pupils: Pupils are equal, round, and reactive to light.  Cardiovascular:     Rate and Rhythm: Normal rate and regular rhythm.  Pulmonary:     Effort: Pulmonary effort is normal.     Breath sounds: Normal breath sounds. No wheezing.  Abdominal:     General: Abdomen is flat.     Tenderness: There is no abdominal tenderness. There is left CVA tenderness. There is no right CVA tenderness.  Skin:    General: Skin is warm and dry.     Capillary Refill: Capillary refill takes less than 2 seconds.  Neurological:     Mental Status: She is alert and oriented to person,  place, and time. Mental status is at baseline.     ED Results / Procedures / Treatments   Labs (all labs ordered are listed, but only abnormal results are displayed) Labs Reviewed  COMPREHENSIVE METABOLIC PANEL - Abnormal; Notable for the following components:      Result Value   Glucose, Bld 169 (*)    All other components within normal limits  URINALYSIS, ROUTINE W REFLEX MICROSCOPIC - Abnormal; Notable for the following components:   APPearance HAZY (*)    Hgb urine dipstick LARGE (*)    Ketones, ur 20 (*)    Protein, ur 30 (*)    Bacteria, UA RARE (*)    All other components within normal limits  CBC  LIPASE, BLOOD  PREGNANCY, URINE    EKG None  Radiology CT Renal Stone Study  Result Date: 04/16/2022 CLINICAL DATA:  52 year old female with history of abdominal and flank pain. Suspected stone. EXAM: CT ABDOMEN AND PELVIS WITHOUT CONTRAST TECHNIQUE: Multidetector CT imaging of the abdomen and pelvis was performed following the standard protocol without IV contrast. RADIATION DOSE REDUCTION: This exam was performed according to the departmental dose-optimization program which includes automated exposure control, adjustment of the mA and/or kV according to patient size and/or use of iterative reconstruction technique. COMPARISON:  No priors. FINDINGS: Lower chest: Unremarkable. Hepatobiliary: Small well-defined low-attenuation lesions in the liver are noted measuring up to 8 mm in the central aspect of segment 8, incompletely characterized on today's noncontrast CT examination, statistically likely to represent tiny cysts (no imaging follow-up recommended). No other larger more suspicious appearing hepatic lesions are noted. Unenhanced appearance of the gallbladder is unremarkable. Pancreas: No definite pancreatic mass or peripancreatic fluid collections or inflammatory changes are noted on today's noncontrast CT examination. Spleen: Unremarkable. Adrenals/Urinary Tract: Nonobstructive  calculi are noted within the collecting systems of both kidneys measuring up to 7 mm in the lower pole collecting system of the left kidney. In addition, at the level of the left ureterovesicular junction (coronal image 49 of series 6) there is a 5 x 2 mm calculus which is associated with mild left proximal hydroureteronephrosis. There is also small amount of left-sided perinephric and periureteric soft tissue stranding. No additional calculi are noted elsewhere along the course of either ureter, or within the lumen of the urinary bladder. Unenhanced appearance of the kidneys, bilateral adrenal glands and urinary bladder is otherwise unremarkable. Stomach/Bowel: Unenhanced appearance of the stomach is normal. No pathologic dilatation of small bowel or colon. Numerous colonic diverticula are noted, particularly in the sigmoid colon, without definite focal surrounding inflammatory changes to indicate an acute diverticulitis at this time. Normal appendix. Vascular/Lymphatic: Atherosclerotic calcifications are noted in the abdominal aorta. No lymphadenopathy noted in the abdomen or pelvis. Reproductive: Status post hysterectomy. Ovaries are unremarkable in appearance. Other: No significant volume  of ascites.  No pneumoperitoneum. Musculoskeletal: There are no aggressive appearing lytic or blastic lesions noted in the visualized portions of the skeleton. IMPRESSION: 1. In addition to multiple nonobstructive calculi in the collecting systems of both kidneys (measuring up to 7 mm in the lower pole collecting system of the left kidney) there is an obstructive calculus at the left ureterovesicular junction measuring 5 x 2 mm with mild proximal left hydroureteronephrosis. 2. Colonic diverticulosis without evidence of acute diverticulitis at this time. 3. Aortic atherosclerosis. 4. Additional incidental findings, as above. Electronically Signed   By: Vinnie Langton M.D.   On: 04/16/2022 11:12    Procedures Procedures     Medications Ordered in ED Medications  ketorolac (TORADOL) 15 MG/ML injection 15 mg (15 mg Intravenous Given 04/16/22 0930)  ondansetron (ZOFRAN) injection 4 mg (4 mg Intravenous Given 04/16/22 0930)  sodium chloride 0.9 % bolus 1,000 mL (0 mLs Intravenous Stopped 04/16/22 1021)  fentaNYL (SUBLIMAZE) injection 50 mcg (50 mcg Intravenous Given 04/16/22 1044)  tamsulosin (FLOMAX) capsule 0.4 mg (0.4 mg Oral Given 04/16/22 1138)    ED Course/ Medical Decision Making/ A&P  Medical Decision Making Amount and/or Complexity of Data Reviewed Labs: ordered. Radiology: ordered.  Risk Prescription drug management.   52 year old female presents to ED for evaluation of left-sided flank pain.  Please see HPI for further details.  On examination patient afebrile, nontachycardic.  Lung sounds are clear bilaterally, she is not hypoxic.  Patient abdomen soft and compressible however patient does have left-sided CVA tenderness on exam.  Patient appears uncomfortable.  Patient workup include CBC, CMP, lipase, urinalysis, CT renal stone study.  Prior to CT renal stone study, me my attending Dr. Sabra Heck utilized bedside ultrasound to assess patient kidneys.  Patient left kidney does have hydronephrosis concerning for obstruction.  CT renal stone study shows 5 x 2 mm stone on the left side at UVJ.  Patient CBC unremarkable without leukocytosis or anemia.  Patient CMP with elevated glucose.  Lipase within no limits.  Urinalysis shows large hemoglobin, ketones, protein.  Patient provided 1 L fluid, 15 mg Toradol, 4 mg Zofran.  Patient reports increased pain after these were given so 50 mcg of fentanyl were administered.  Patient provided first dose of tamsulosin here in the department.  The patient will be sent home with urology follow-up, pain control, antinausea medication, expulsive therapy.  Patient provided return precautions and she voiced understanding.  Patient had all of her questions answered to her  satisfaction.  Patient stable for discharge.   Final Clinical Impression(s) / ED Diagnoses Final diagnoses:  Renal stone    Rx / DC Orders ED Discharge Orders          Ordered    HYDROcodone-acetaminophen (NORCO/VICODIN) 5-325 MG tablet  Every 6 hours PRN        04/16/22 1140    tamsulosin (FLOMAX) 0.4 MG CAPS capsule  Daily        04/16/22 1140    ondansetron (ZOFRAN) 4 MG tablet  Every 6 hours        04/16/22 1140              Azucena Cecil, [place] 04/16/22 1140    Noemi Chapel, MD 04/17/22 0710

## 2023-10-27 IMAGING — MG MM DIGITAL DIAGNOSTIC UNILAT*R* W/ TOMO W/ CAD
4 series · 4 of 12 positions shown · non-contrast
Comparison: Previous exam(s).

CLINICAL DATA: Short-term interval follow-up of a likely benign
asymmetry in the outer RIGHT breast.

EXAM:
DIGITAL DIAGNOSTIC UNILATERAL RIGHT MAMMOGRAM WITH TOMOSYNTHESIS AND
CAD
TECHNIQUE: Right digital diagnostic mammography and breast tomosynthesis was
performed. The images were evaluated with computer-aided detection.

[R CC synth-2D]
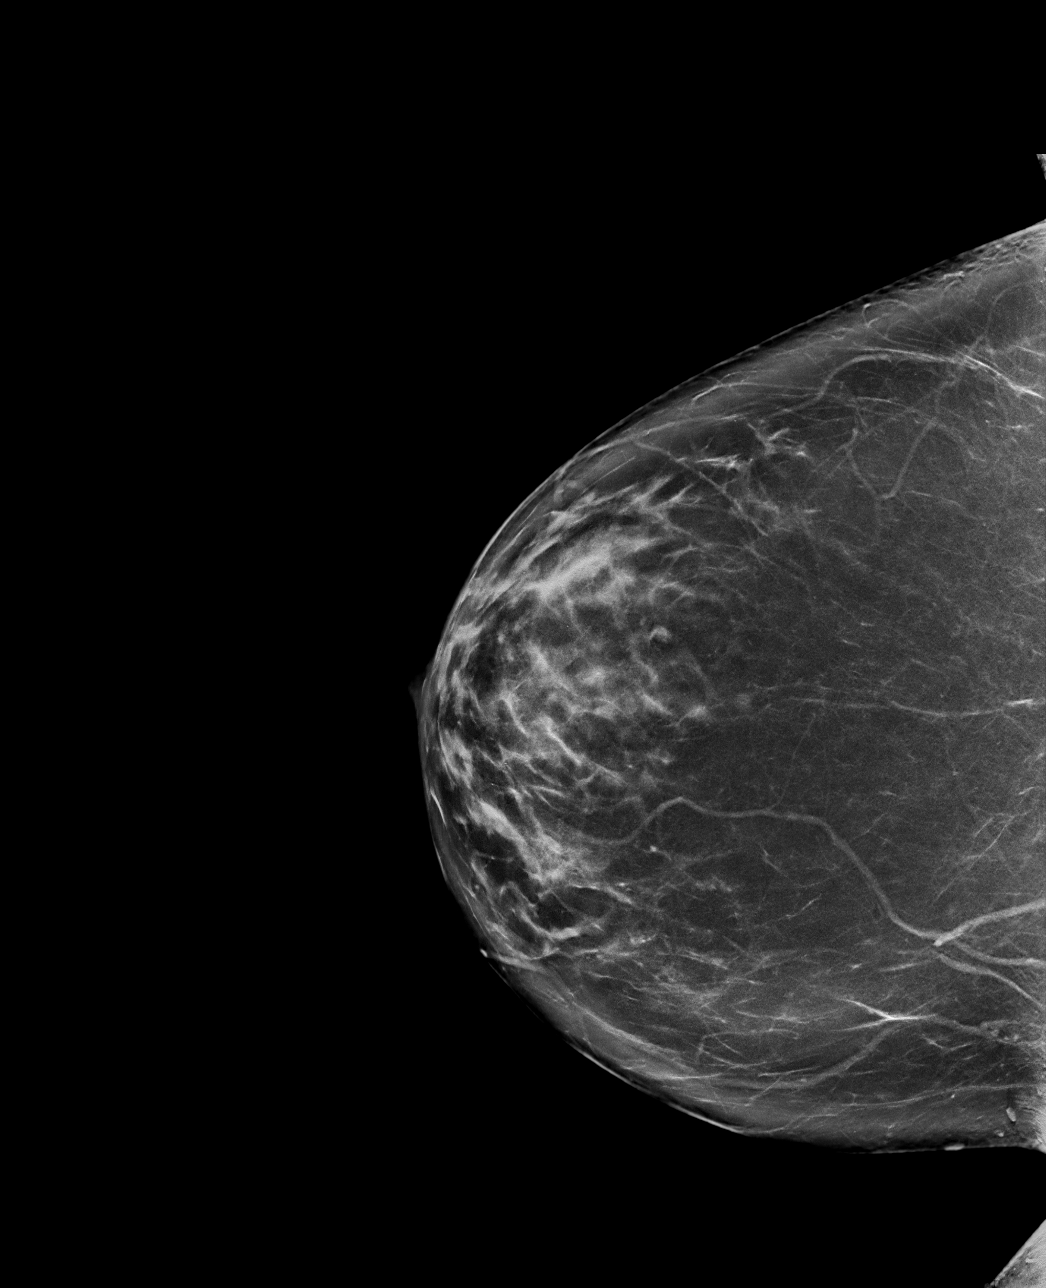

[R MLO synth-2D]
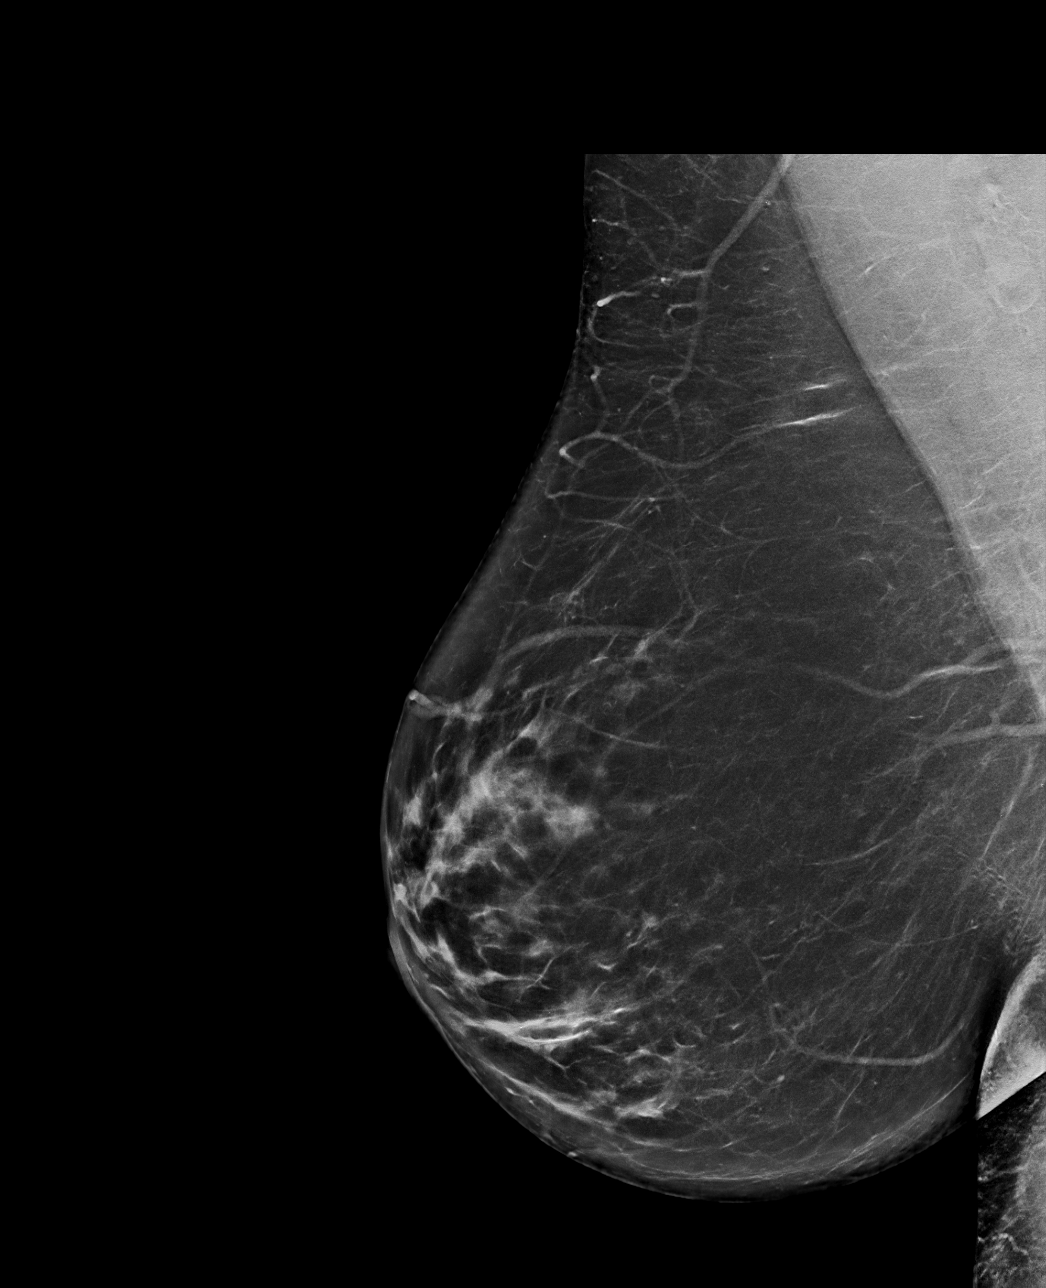

[R MLO tomo · tomo slice 45/88.0]
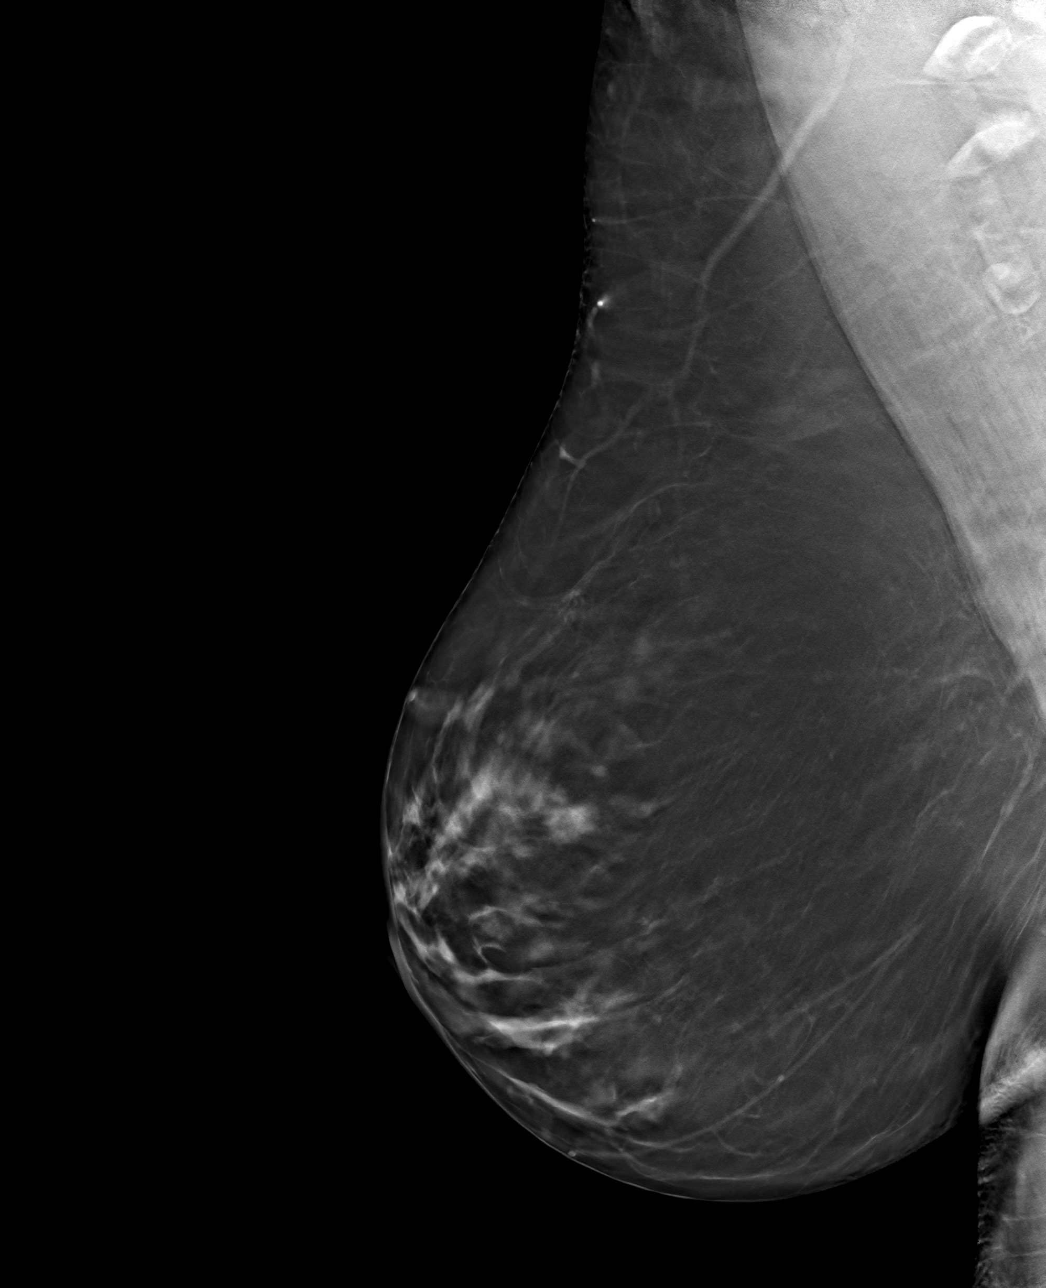

[R CC tomo · tomo slice 43/85.0]
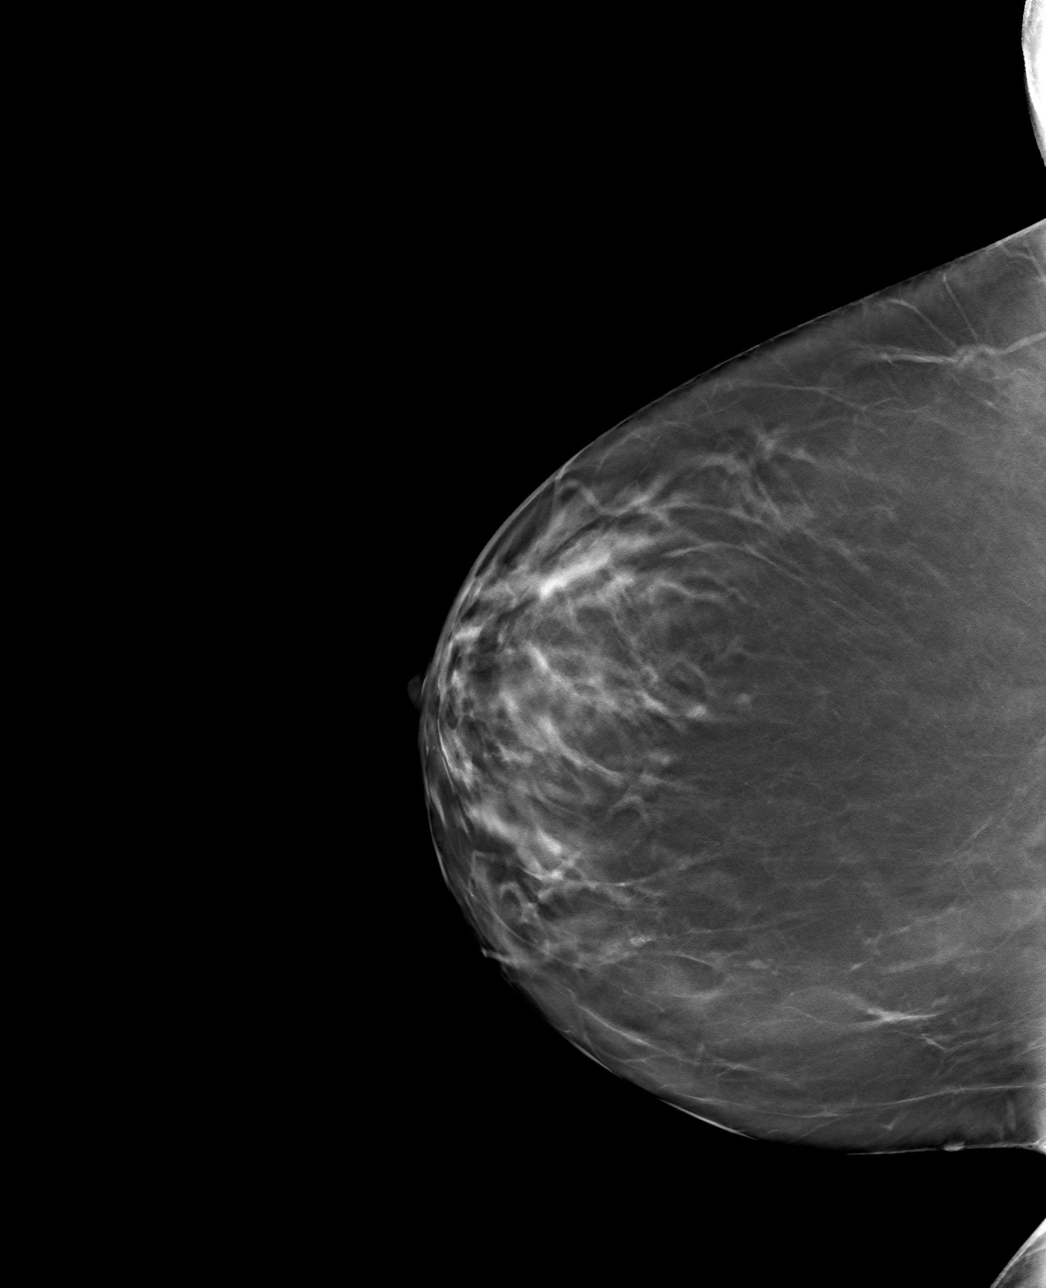

[4 of 12 positions shown; findings below may reference images not displayed]

ACR Breast Density Category b: There are scattered areas of
fibroglandular density.
FINDINGS: Full field CC and MLO views were obtained.

No findings suspicious for malignancy. The asymmetry in the outer
breast questioned previously is inconspicuous currently, indicating
that it represented overlapping fibroglandular tissue.
IMPRESSION: No mammographic evidence of malignancy involving the RIGHT breast.

RECOMMENDATION:
Annual BILATERAL screening mammography in 6 months.

I have discussed the findings and recommendations with the patient.
If applicable, a reminder letter will be sent to the patient
regarding the next appointment.

BI-RADS CATEGORY  1: Negative.

## 2023-12-28 ENCOUNTER — Other Ambulatory Visit: Payer: Self-pay

## 2023-12-28 ENCOUNTER — Encounter (HOSPITAL_COMMUNITY): Payer: Self-pay | Admitting: Emergency Medicine

## 2023-12-28 ENCOUNTER — Emergency Department (HOSPITAL_COMMUNITY)

## 2023-12-28 ENCOUNTER — Emergency Department (HOSPITAL_COMMUNITY): Admission: EM | Admit: 2023-12-28 | Discharge: 2023-12-28 | Disposition: A | Discharge status: Home / Self Care

## 2023-12-28 DIAGNOSIS — N132 Hydronephrosis with renal and ureteral calculous obstruction: Secondary | ICD-10-CM | POA: Diagnosis not present

## 2023-12-28 DIAGNOSIS — N200 Calculus of kidney: Secondary | ICD-10-CM

## 2023-12-28 DIAGNOSIS — R1011 Right upper quadrant pain: Secondary | ICD-10-CM | POA: Diagnosis present

## 2023-12-28 LAB — URINALYSIS, ROUTINE W REFLEX MICROSCOPIC
Bilirubin Urine: NEGATIVE
Glucose, UA: NEGATIVE mg/dL
Ketones, ur: 20 mg/dL — AB
Leukocytes,Ua: NEGATIVE
Nitrite: NEGATIVE
Protein, ur: NEGATIVE mg/dL
Specific Gravity, Urine: 1.014 (ref 1.005–1.030)
pH: 7 (ref 5.0–8.0)

## 2023-12-28 LAB — PREGNANCY, URINE: Preg Test, Ur: NEGATIVE

## 2023-12-28 LAB — COMPREHENSIVE METABOLIC PANEL WITH GFR
ALT: 19 U/L (ref 0–44)
AST: 23 U/L (ref 15–41)
Albumin: 4 g/dL (ref 3.5–5.0)
Alkaline Phosphatase: 47 U/L (ref 38–126)
Anion gap: 10 (ref 5–15)
BUN: 11 mg/dL (ref 6–20)
CO2: 25 mmol/L (ref 22–32)
Calcium: 9.6 mg/dL (ref 8.9–10.3)
Chloride: 104 mmol/L (ref 98–111)
Creatinine, Ser: 0.77 mg/dL (ref 0.44–1.00)
GFR, Estimated: >60 mL/min (ref >60)
Glucose, Bld: 123 mg/dL — ABNORMAL HIGH (ref 70–99)
Potassium: 4.1 mmol/L (ref 3.5–5.1)
Sodium: 139 mmol/L (ref 135–145)
Total Bilirubin: 0.6 mg/dL (ref 0.0–1.2)
Total Protein: 7.1 g/dL (ref 6.5–8.1)

## 2023-12-28 LAB — CBC
HCT: 43.4 % (ref 36.0–46.0)
Hemoglobin: 14.6 g/dL (ref 12.0–15.0)
MCH: 31.6 pg (ref 26.0–34.0)
MCHC: 33.6 g/dL (ref 30.0–36.0)
MCV: 93.9 fL (ref 80.0–100.0)
Platelets: 265 K/uL (ref 150–400)
RBC: 4.62 MIL/uL (ref 3.87–5.11)
RDW: 12.2 % (ref 11.5–15.5)
WBC: 8 K/uL (ref 4.0–10.5)
nRBC: 0 % (ref 0.0–0.2)

## 2023-12-28 LAB — LIPASE, BLOOD: Lipase: 24 U/L (ref 11–51)

## 2023-12-28 MED ORDER — TAMSULOSIN HCL 0.4 MG PO CAPS: 0.4000 mg | ORAL_CAPSULE | Freq: Every day | ORAL | 0 refills | Status: DC

## 2023-12-28 MED ORDER — IBUPROFEN 600 MG PO TABS: 600.0000 mg | ORAL_TABLET | Freq: Four times a day (QID) | ORAL | 0 refills | Status: DC | PRN

## 2023-12-28 MED ORDER — KETOROLAC TROMETHAMINE 15 MG/ML IJ SOLN
15.0000 mg | Freq: Once | INTRAMUSCULAR | Status: DC
  Filled 2023-12-28: qty 1

## 2023-12-28 MED ORDER — KETOROLAC TROMETHAMINE 15 MG/ML IJ SOLN
15.0000 mg | Freq: Once | INTRAMUSCULAR | Status: AC
  Administered 2023-12-28: 15 mg via INTRAVENOUS

## 2023-12-28 NOTE — Discharge Instructions (Addendum)
 Your pain is related to a 2 x 4 mm kidney stone on the right side quite close to your bladder.  I anticipate you will likely be able to pass the stone shortly.  You may take medication prescribed and follow-up closely with [facility] for further care.

## 2023-12-28 NOTE — ED Provider Notes (Signed)
 [place] EMERGENCY DEPARTMENT AT [facility] Provider Note   CSN: [id number] Arrival date & time: 12/28/23  1415     Patient presents with: Abdominal Pain   Kelly Davenport is a 53 y.o. female.   The history is provided by the patient and medical records. No language interpreter was used.  Abdominal Pain    53 year old female with history of renal disorder, presenting to the ED today with complaint of abdominal pain.  Patient states she noticed pain to the right side of her abdomen that started earlier today.  Pain is intense, comes in waves, felt similar to prior kidney stone.  She felt nauseous throughout the day without vomiting.  She mention pain has since subsided but not fully resolved.  She recall having kidney stones in the past with back pain but today's pain is more towards her right upper abdomen.  She also endorsed having recurrent loose stools over the past several months treated with Pepto-Bismol.  She denies any recent antibiotic use.  She has not noticed any blood in the urine but did notice some increased urinary frequency and urge to urinate.  Prior to Admission medications   Medication Sig Start Date End Date Taking? Authorizing Provider  HYDROcodone -acetaminophen  (NORCO/VICODIN) 5-325 MG tablet Take 1 tablet by mouth every 6 (six) hours as needed for severe pain. 04/16/22   Ruthell Lonni FALCON, PA-C  ketorolac  (TORADOL ) 10 MG tablet Take 1 tablet (10 mg total) by mouth every 6 (six) hours as needed. 04/16/22   Ruthell Lonni FALCON, PA-C  Multiple Vitamin (MULTIVITAMIN) tablet Take 1 tablet by mouth daily.    [provider]  nitrofurantoin, macrocrystal-monohydrate, (MACROBID) 100 MG capsule Take 100 mg by mouth 2 (two) times daily. Finished 10-31-15 10/26/15   [provider]  ondansetron  (ZOFRAN ) 4 MG tablet Take 1 tablet (4 mg total) by mouth every 6 (six) hours. 10/31/15   Olympia Gee, PA-C  ondansetron  (ZOFRAN ) 4 MG tablet Take 1 tablet  (4 mg total) by mouth every 6 (six) hours. 04/16/22   Ruthell Lonni FALCON, PA-C  ranitidine (ZANTAC) 150 MG tablet Take 150 mg by mouth daily.    [provider]  tamsulosin  (FLOMAX ) 0.4 MG CAPS capsule Take 1 capsule (0.4 mg total) by mouth daily. 04/16/22   Ruthell Lonni FALCON, PA-C    Allergies: Patient has no known allergies.    Review of Systems  Gastrointestinal:  Positive for abdominal pain.  All other systems reviewed and are negative.   Updated Vital Signs BP (!) 150/100   Pulse 75   Temp 97.8 F (36.6 C) (Oral)   Resp 16   SpO2 100%   Physical Exam Vitals and nursing note reviewed.  Constitutional:      General: She is not in acute distress.    Appearance: She is well-developed.  HENT:     Head: Atraumatic.  Eyes:     Conjunctiva/sclera: Conjunctivae normal.  Cardiovascular:     Rate and Rhythm: Normal rate and regular rhythm.     Pulses: Normal pulses.     Heart sounds: Normal heart sounds.  Pulmonary:     Effort: Pulmonary effort is normal.  Abdominal:     Palpations: Abdomen is soft.     Tenderness: There is abdominal tenderness (Mild tenderness right upper quadrant no guarding no rebound tenderness). There is no right CVA tenderness or left CVA tenderness.  Musculoskeletal:     Cervical back: Neck supple.  Skin:    Findings: No  rash.  Neurological:     Mental Status: She is alert.  Psychiatric:        Mood and Affect: Mood normal.     (all labs ordered are listed, but only abnormal results are displayed) Labs Reviewed  COMPREHENSIVE METABOLIC PANEL WITH GFR - Abnormal; Notable for the following components:      Result Value   Glucose, Bld 123 (*)    All other components within normal limits  URINALYSIS, ROUTINE W REFLEX MICROSCOPIC - Abnormal; Notable for the following components:   APPearance TURBID (*)    Hgb urine dipstick SMALL (*)    Ketones, ur 20 (*)    Bacteria, UA RARE (*)    All other components within normal limits   LIPASE, BLOOD  CBC  PREGNANCY, URINE    EKG: None  Radiology: CT Renal Stone Study Result Date: 12/28/2023 EXAM: CT ABDOMEN AND PELVIS WITHOUT CONTRAST 12/28/2023 08:43:00 PM TECHNIQUE: CT of the abdomen and pelvis was performed without the administration of intravenous contrast. Multiplanar reformatted images are provided for review. Automated exposure control, iterative reconstruction, and/or weight-based adjustment of the mA/kV was utilized to reduce the radiation dose to as low as reasonably achievable. COMPARISON: None available. CLINICAL HISTORY: Abdominal/flank pain, stone suspected. FINDINGS: LOWER CHEST: The visualized diaphragmatic region is incompletely included on this examination. LIVER: The visualized liver is unremarkable. GALLBLADDER AND BILE DUCTS: Gallbladder is unremarkable. No biliary ductal dilatation. SPLEEN: Visualized spleen is unremarkable. PANCREAS: No acute abnormality. ADRENAL GLANDS: No acute abnormality. KIDNEYS, URETERS AND BLADDER: Multiple nonobstructing renal calculi are seen within the kidneys bilaterally measuring up to 8 mm within the lower pole of the left kidney and 2 mm within the lower pole of the right kidney. There is mild right hydronephrosis secondary to a minimally obstructing 2 x 4 mm calculus in the right ureterovesicular junction. No hydronephrosis on the left. No additional ureteral calculi. No perinephric or periureteral stranding. Urinary bladder is unremarkable. GI AND BOWEL: Gastric fundus is incompletely included on this exam. The stomach is otherwise unremarkable. Moderate sigmoid diverticulosis. The small bowel and large bowel are otherwise unremarkable. Appendix normal. There is no bowel obstruction. PERITONEUM AND RETROPERITONEUM: No ascites. No free air. VASCULATURE: Aorta is normal in caliber. LYMPH NODES: No lymphadenopathy. REPRODUCTIVE ORGANS: Status post hysterectomy. No adnexal masses. BONES AND SOFT TISSUES: Osseous structures are age  appropriate. No acute bone abnormality. No lytic or blastic bone lesion. No focal soft tissue abnormality. IMPRESSION: 1. Mild right hydronephrosis secondary to a minimally obstructing 2 x 4 mm calculus at the right ureterovesicular junction. 2. Multiple nonobstructing renal calculi bilaterally, measuring up to 8 mm in the lower pole of the left kidney and 2 mm in the lower pole of the right kidney. 3. Moderate sigmoid diverticulosis without evidence of diverticulitis. Electronically signed by: Dorethia Molt MD 12/28/2023 08:55 PM EDT RP Workstation: [id number]     Procedures   Medications Ordered in the ED  ketorolac  (TORADOL ) 15 MG/ML injection 15 mg (has no administration in time range)                                    Medical Decision Making Amount and/or Complexity of Data Reviewed Labs: ordered. Radiology: ordered.   BP (!) 150/100   Pulse 75   Temp 97.8 F (36.6 C) (Oral)   Resp 16   Ht 5' 3 (1.6 m)   Wt 67.1 kg  SpO2 100%   BMI 26.22 kg/m   8:33 PM  53 year old female with history of renal disorder, presenting to the ED today with complaint of abdominal pain.  Patient states she noticed pain to the right side of her abdomen that started earlier today.  Pain is intense, comes in waves, felt similar to prior kidney stone.  She felt nauseous throughout the day without vomiting.  She mention pain has since subsided but not fully resolved.  She recall having kidney stones in the past with back pain but today's pain is more towards her right upper abdomen.  She also endorsed having recurrent loose stools over the past several months treated with Pepto-Bismol.  She denies any recent antibiotic use.  She has not noticed any blood in the urine but did notice some increased urinary frequency and urge to urinate.  On exam patient does not have any significant CVA tenderness.  She has some mild tenderness to her right side abdomen on palpation.  Patient overall well-appearing.  Vital  signs normal..  -Labs ordered, independently viewed and interpreted by me.  Labs remarkable for urinalysis with evidence of blood but no signs of infection.  Labs otherwise reassuring. -The patient was maintained on a cardiac monitor.  I personally viewed and interpreted the cardiac monitored which showed an underlying rhythm of: Normal sinus rhythm. -Imaging independently viewed and interpreted by me and I agree with radiologist's interpretation.  Result remarkable for abdominal pelvis CT scan shows a mild right hydronephrosis secondary to a minimally obstructive 2 x 4 mm calculus at the right UVJ.  Patient also has multiple nonobstructive kidney stone in both kidney.  Evidence of diverticulosis but no evidence of diverticulitis. -This patient presents to the ED for concern of abdominal pain, this involves an extensive number of treatment options, and is a complaint that carries with it a high risk of complications and morbidity.  The differential diagnosis includes kidney stone, UTI, infected kidney stone, colitis, diverticulitis, pancreatitis, cholecystitis -Co morbidities that complicate the patient evaluation includes renal disorder, kidney stone -Treatment includes Toradol  -Reevaluation of the patient after these medicines showed that the patient improved -PCP office notes or outside notes reviewed -Escalation to admission/observation considered: patients feels much better, is comfortable with discharge, and will follow up with PCP -Prescription medication considered, patient comfortable with ibuprofen and flomax  -Social Determinant of Health considered        Final diagnoses:  Kidney stone on right side    ED Discharge Orders          Ordered    tamsulosin  (FLOMAX ) 0.4 MG CAPS capsule  Daily        12/28/23 2216    ibuprofen (ADVIL) 600 MG tablet  Every 6 hours PRN        12/28/23 2216               Nivia Colon, PA-C 12/28/23 2217    Simon Lavonia SAILOR, MD 12/29/23  1932

## 2023-12-28 NOTE — ED Triage Notes (Signed)
 QUICK TRIAGE: Pt ambulatory to triage with c/o right lower abdominal pain and nausea starting around 2AM.  Pt also reports urinary symptoms very similar to a UTI.

## 2023-12-28 NOTE — ED Notes (Signed)
 Patient transported to CT

## 2023-12-28 NOTE — ED Notes (Signed)
 Patient reports RUQ abdominal pain that began at 10AM today accompanied by N/V. Patient has a Hx of kidney stones. Patient also reports watery diarrhea for the past 8-10 weeks.

## 2023-12-28 NOTE — ED Notes (Signed)
Patient ambulatory to RR

## 2024-02-05 ENCOUNTER — Other Ambulatory Visit: Payer: Self-pay | Admitting: Urology

## 2024-02-06 ENCOUNTER — Other Ambulatory Visit: Payer: Self-pay | Admitting: Urology

## 2024-02-15 ENCOUNTER — Encounter (HOSPITAL_COMMUNITY): Payer: Self-pay | Admitting: Urology

## 2024-02-15 NOTE — Progress Notes (Signed)
 Attempted to obtain medical history for pre op call via telephone, unable to reach at this time. HIPAA compliant voicemail message left requesting return call to pre surgical testing department.

## 2024-02-15 NOTE — Progress Notes (Signed)
 [facility] PHONE CALL   ALLERGIES REVIEWED: YES  MEDICATION REVIEW DONE: YES MEDICATIONS THAT PT SHOULD HOLD (LIST): Tordol hold 24hr, NSAIDS hold 48hr  CAN PT WALK UP STAIRS WITHOUT SHORTNESS OF BREATH: YES HOME O2: NO CPAP: NO  IF YES, INFORMED PT TO BRING CPAP WITH TUBING AND MASK:YES/NO   INFORMED DRIVER NEEDED FOR PROCEDURE: YES   PT WAS GIVEN BLUE FOLDER AT UROLOGY APPT: YES PT INFORMED TO BRING BLUE FOLDER WITH ALL CONTENTS: YES  REVIEWED ARRIVAL TIME AND LOCATION: YES  OTHER PERTINENT INFORMATION: Patients ride will have to bring 6/yo child, informed her of the restrictions so they will be in car- d/c instructions to be called to s/o.

## 2024-02-19 ENCOUNTER — Encounter: Admission: RE | Disposition: A | Payer: Self-pay | Attending: Urology

## 2024-02-19 ENCOUNTER — Ambulatory Visit (HOSPITAL_COMMUNITY)
Admission: RE | Admit: 2024-02-19 | Discharge: 2024-02-19 | Disposition: A | Discharge status: Home / Self Care | Attending: Urology | Admitting: Urology

## 2024-02-19 ENCOUNTER — Other Ambulatory Visit: Payer: Self-pay

## 2024-02-19 ENCOUNTER — Ambulatory Visit (HOSPITAL_COMMUNITY)

## 2024-02-19 ENCOUNTER — Encounter (HOSPITAL_COMMUNITY): Payer: Self-pay | Admitting: Urology

## 2024-02-19 DIAGNOSIS — Z79899 Other long term (current) drug therapy: Secondary | ICD-10-CM | POA: Insufficient documentation

## 2024-02-19 DIAGNOSIS — N200 Calculus of kidney: Secondary | ICD-10-CM | POA: Insufficient documentation

## 2024-02-19 DIAGNOSIS — N201 Calculus of ureter: Secondary | ICD-10-CM | POA: Diagnosis present

## 2024-02-19 DIAGNOSIS — Z8541 Personal history of malignant neoplasm of cervix uteri: Secondary | ICD-10-CM | POA: Diagnosis not present

## 2024-02-19 DIAGNOSIS — Z01818 Encounter for other preprocedural examination: Secondary | ICD-10-CM

## 2024-02-19 HISTORY — PX: EXTRACORPOREAL SHOCK WAVE LITHOTRIPSY: SHX1557

## 2024-02-19 LAB — POCT PREGNANCY, URINE: Preg Test, Ur: NEGATIVE

## 2024-02-19 SURGERY — LITHOTRIPSY, ESWL
Anesthesia: LOCAL | Laterality: Left

## 2024-02-19 MED ORDER — CIPROFLOXACIN HCL 500 MG PO TABS
500.0000 mg | ORAL_TABLET | ORAL | Status: AC
  Administered 2024-02-19: 500 mg via ORAL
  Filled 2024-02-19: qty 1

## 2024-02-19 MED ORDER — SODIUM CHLORIDE 0.9 % IV SOLN: INTRAVENOUS | Status: DC

## 2024-02-19 MED ORDER — OXYCODONE-ACETAMINOPHEN 5-325 MG PO TABS: 1.0000 | ORAL_TABLET | ORAL | 0 refills | Status: AC | PRN

## 2024-02-19 MED ORDER — DIAZEPAM 5 MG PO TABS
10.0000 mg | ORAL_TABLET | ORAL | Status: AC
  Administered 2024-02-19: 10 mg via ORAL
  Filled 2024-02-19: qty 2

## 2024-02-19 MED ORDER — DIPHENHYDRAMINE HCL 25 MG PO CAPS
25.0000 mg | ORAL_CAPSULE | ORAL | Status: AC
  Administered 2024-02-19: 25 mg via ORAL
  Filled 2024-02-19: qty 1

## 2024-02-19 NOTE — Op Note (Signed)
 See [employer] operative note scanned into chart. Also because of the size, density, location and other factors that cannot be anticipated I feel this will likely be a staged procedure. This fact supersedes any indication in the scanned [place] [facility] operative note to the contrary.

## 2024-02-19 NOTE — Interval H&P Note (Signed)
 History and Physical Interval Note:  02/19/2024 7:40 AM  Kelly Davenport  has presented today for surgery, with the diagnosis of LEFT URETERAL STONE.  The various methods of treatment have been discussed with the patient and family. After consideration of risks, benefits and other options for treatment, the patient has consented to  Procedures: LITHOTRIPSY, ESWL (Left) as a surgical intervention.  The patient's history has been reviewed, patient examined, no change in status, stable for surgery.  I have reviewed the patient's chart and labs.  Questions were answered to the patient's satisfaction.     [employer]

## 2024-02-19 NOTE — Discharge Instructions (Addendum)
1. You should strain your urine and collect all fragments and bring them to your follow up appointment.  2. You should take your pain medication as needed.  Please call if your pain is severe to the point that it is not controlled with your pain medication. 3. You should call if you develop fever > 101 or persistent nausea or vomiting.

## 2024-02-20 ENCOUNTER — Encounter (HOSPITAL_COMMUNITY): Payer: Self-pay | Admitting: Urology
